# Patient Record
Sex: Male | Born: 1980 | Race: Black or African American | Hispanic: No | Marital: Single | Smoking: Never smoker
Health system: Southern US, Community
[De-identification: ages and names within clinical notes are randomized; demographics above are authoritative.]

---

## 2009-07-05 ENCOUNTER — Emergency Department (HOSPITAL_COMMUNITY): Admission: EM | Admit: 2009-07-05 | Discharge: 2009-07-05 | Payer: Self-pay | Admitting: Emergency Medicine

## 2012-06-08 ENCOUNTER — Encounter (HOSPITAL_COMMUNITY): Payer: Self-pay

## 2012-06-08 ENCOUNTER — Emergency Department (HOSPITAL_COMMUNITY)
Admission: EM | Admit: 2012-06-08 | Discharge: 2012-06-08 | Disposition: A | Discharge status: Home / Self Care | Payer: BC Managed Care – PPO | Attending: Emergency Medicine | Admitting: Emergency Medicine

## 2012-06-08 DIAGNOSIS — R369 Urethral discharge, unspecified: Secondary | ICD-10-CM | POA: Insufficient documentation

## 2012-06-08 DIAGNOSIS — N489 Disorder of penis, unspecified: Secondary | ICD-10-CM

## 2012-06-08 MED ORDER — CEFPODOXIME PROXETIL 200 MG PO TABS
400.0000 mg | ORAL_TABLET | Freq: Once | ORAL | Status: AC
  Administered 2012-06-08: 400 mg via ORAL
  Filled 2012-06-08: qty 2

## 2012-06-08 MED ORDER — AZITHROMYCIN 250 MG PO TABS
1000.0000 mg | ORAL_TABLET | Freq: Once | ORAL | Status: AC
  Administered 2012-06-08: 1000 mg via ORAL
  Filled 2012-06-08: qty 4

## 2012-06-08 NOTE — ED Notes (Signed)
Called lab to check on results of testing. Was advised both were send out. EDP notified.

## 2012-06-08 NOTE — Discharge Instructions (Signed)
You have been treated with antibiotics for STDs. The blood test for syphilis will not be back today. If it should come back positive, you will be notified so we can treat you.

## 2012-06-08 NOTE — ED Notes (Signed)
Having a painful erection and I had some blisters at the base of my penis per pt.

## 2012-06-08 NOTE — ED Notes (Signed)
Pt reports blisters to his penis, painful when erect. Unprotected sex w/ 1 partner. Pt states is having white discharge.

## 2012-06-08 NOTE — ED Notes (Signed)
Patient does not need anything at this time. 

## 2012-06-08 NOTE — ED Notes (Signed)
Pt alert & oriented x4, stable gait. Pt given discharge instructions, paperwork & prescription(s). Patient instructed to stop at the registration desk to finish any additional paperwork. pt verbalized understanding. Pt left department w/ no further questions.  

## 2012-06-09 LAB — RPR: RPR Ser Ql: NONREACTIVE

## 2012-06-09 NOTE — ED Provider Notes (Signed)
History     CSN: [id number]  Arrival date & time 06/08/12  0102   First MD Initiated Contact with Patient 06/08/12 0316      Chief Complaint  Patient presents with  . Penis Pain    (Consider location/radiation/quality/duration/timing/severity/associated sxs/prior treatment) HPI  Hunter Daugherty is a 31 y.o. male who presents to the Emergency Department complaining of painful erection, lesions at base of penis and penile discharge. Patient has had a partner for 4 months, having unprotected intercourse,  and noted that last week he developed a penile discharge. No pain with urination however, had a painful erection two days ago due to lesions at the base of the penis he has had for several days. Last intercourse was two days ago. Denies fever, chills, night sweats. History reviewed. No pertinent past medical history.  History reviewed. No pertinent past surgical history.  History reviewed. No pertinent family history.  History  Substance Use Topics  . Smoking status: Never Smoker   . Smokeless tobacco: Not on file  . Alcohol Use: Yes      Review of Systems  Constitutional: Negative for fever.       10 Systems reviewed and are negative for acute change except as noted in the HPI.  HENT: Negative for congestion.   Eyes: Negative for discharge and redness.  Respiratory: Negative for cough and shortness of breath.   Cardiovascular: Negative for chest pain.  Gastrointestinal: Negative for vomiting and abdominal pain.  Genitourinary: Positive for discharge, genital sores and penile pain.  Musculoskeletal: Negative for back pain.  Skin: Negative for rash.  Neurological: Negative for syncope, numbness and headaches.  Psychiatric/Behavioral:       No behavior change.    Allergies  Review of patient's allergies indicates no known allergies.  Home Medications  No current outpatient prescriptions on file.  BP 135/89  Pulse 75  Temp 98.3 F (36.8 C) (Oral)  Resp 16  Ht 5'  6" (1.676 m)  Wt 190 lb (86.183 kg)  BMI 30.67 kg/m2  SpO2 98%  Physical Exam  Nursing note and vitals reviewed. Constitutional:       Awake, alert, nontoxic appearance.  HENT:  Head: Atraumatic.  Eyes: Right eye exhibits no discharge. Left eye exhibits no discharge.  Neck: Neck supple.  Cardiovascular: Regular rhythm and normal heart sounds.   Pulmonary/Chest: Effort normal. He exhibits no tenderness.  Abdominal: Soft. There is no tenderness. There is no rebound.  Genitourinary:       Genital  exam performed with pt permission and male ED nurse chaparone present during exam.  Pt examined laying and standing.  No perineal erythema. Circumcised, healing abrasion type lesions to each side of the shaft of the penis as well as on the base of the penis. NO drainage. Penile discharge present. No scrotal erythema, edema or tenderness to palp.  Normal testicular lie.  No testicular tenderness to palp.  +cremasteric reflexes bilat.  No inguinal LAN or palpable masses.     Musculoskeletal: He exhibits no tenderness.       Baseline ROM, no obvious new focal weakness.  Neurological:       Mental status and motor strength appears baseline for patient and situation.  Skin: No rash noted.  Psychiatric: He has a normal mood and affect.    ED Course  Procedures (including critical care time)   Labs Reviewed  RPR  GC/CHLAMYDIA PROBE AMP, GENITAL   No results found.   1. Penile discharge  2. Penile lesion       MDM  Patient with concern of exposure to STDs, with a penile discharge and lesions to the base of the penis. Lesion are c/w friction abrasions and not herpetic or chancre. Discharge was cultured and is pending. Patient treated for STDs. RPR pending. Pt stable in ED with no significant deterioration in condition.The patient appears reasonably screened and/or stabilized for discharge and I doubt any other medical condition or other [facility] requiring further screening, evaluation, or  treatment in the ED at this time prior to discharge.  MDM Reviewed: nursing note and vitals           Nicoletta Dress. Colon Branch, MD 06/09/12 [phone]

## 2012-06-10 NOTE — ED Notes (Signed)
+   Gonorrhea Patient treated with rocephin and zithromax -[employer] letter faxed 

## 2013-02-23 ENCOUNTER — Emergency Department (HOSPITAL_COMMUNITY)
Admission: EM | Admit: 2013-02-23 | Discharge: 2013-02-23 | Disposition: A | Discharge status: Home / Self Care | Payer: Self-pay | Attending: Emergency Medicine | Admitting: Emergency Medicine

## 2013-02-23 ENCOUNTER — Encounter (HOSPITAL_COMMUNITY): Payer: Self-pay

## 2013-02-23 DIAGNOSIS — H9209 Otalgia, unspecified ear: Secondary | ICD-10-CM | POA: Insufficient documentation

## 2013-02-23 DIAGNOSIS — R059 Cough, unspecified: Secondary | ICD-10-CM | POA: Insufficient documentation

## 2013-02-23 DIAGNOSIS — J3489 Other specified disorders of nose and nasal sinuses: Secondary | ICD-10-CM | POA: Insufficient documentation

## 2013-02-23 DIAGNOSIS — J329 Chronic sinusitis, unspecified: Secondary | ICD-10-CM | POA: Insufficient documentation

## 2013-02-23 DIAGNOSIS — R51 Headache: Secondary | ICD-10-CM | POA: Insufficient documentation

## 2013-02-23 DIAGNOSIS — R05 Cough: Secondary | ICD-10-CM | POA: Insufficient documentation

## 2013-02-23 DIAGNOSIS — R6883 Chills (without fever): Secondary | ICD-10-CM | POA: Insufficient documentation

## 2013-02-23 DIAGNOSIS — M549 Dorsalgia, unspecified: Secondary | ICD-10-CM | POA: Insufficient documentation

## 2013-02-23 DIAGNOSIS — J40 Bronchitis, not specified as acute or chronic: Secondary | ICD-10-CM | POA: Insufficient documentation

## 2013-02-23 LAB — RAPID STREP SCREEN (MED CTR MEBANE ONLY): Streptococcus, Group A Screen (Direct): NEGATIVE

## 2013-02-23 MED ORDER — GUAIFENESIN-CODEINE 100-10 MG/5ML PO SYRP: 5.0000 mL | ORAL_SOLUTION | Freq: Three times a day (TID) | ORAL | Status: DC | PRN

## 2013-02-23 MED ORDER — PSEUDOEPHEDRINE HCL 60 MG PO TABS: 60.0000 mg | ORAL_TABLET | Freq: Three times a day (TID) | ORAL | Status: DC

## 2013-02-23 MED ORDER — AZITHROMYCIN 250 MG PO TABS: ORAL_TABLET | ORAL | Status: DC

## 2013-02-23 NOTE — ED Provider Notes (Signed)
History     CSN: [id number]  Arrival date & time 02/23/13  0903   First MD Initiated Contact with Patient 02/23/13 [phone]      Chief Complaint  Patient presents with  . Sore Throat   HPI Hunter Daugherty is a 32 y.o. male who presents to the ED with sore throat. The sore throat started 5 days ago. Associated symptoms include nasal drainage, sinus pain, ear pain and cough. The cough is productive with yellow/green sputum. He denies fever, has had chills. The history was provided by the patient.  History reviewed. No pertinent past medical history.  History reviewed. No pertinent past surgical history.  No family history on file.  History  Substance Use Topics  . Smoking status: Never Smoker   . Smokeless tobacco: Not on file  . Alcohol Use: Yes     Comment: occ      Review of Systems  Constitutional: Positive for chills. Negative for fever, diaphoresis and fatigue.  HENT: Positive for ear pain, congestion, sore throat and sinus pressure. Negative for facial swelling, neck pain, neck stiffness and dental problem.   Eyes: Negative for photophobia, pain and discharge.  Respiratory: Positive for cough. Negative for chest tightness and wheezing.   Cardiovascular: Negative for chest pain and palpitations.  Gastrointestinal: Negative for nausea, vomiting, abdominal pain, diarrhea, constipation and abdominal distention.  Genitourinary: Negative for dysuria, frequency, flank pain and difficulty urinating.  Musculoskeletal: Positive for back pain (with cough). Negative for myalgias and gait problem.  Skin: Negative for color change and rash.  Neurological: Positive for headaches. Negative for dizziness, speech difficulty, weakness, light-headedness and numbness.  Psychiatric/Behavioral: Negative for confusion and agitation. The patient is not nervous/anxious.     Allergies  Review of patient's allergies indicates no known allergies.  Home Medications  No current outpatient  prescriptions on file.  BP 122/93  Pulse 82  Temp(Src) 98.4 F (36.9 C) (Oral)  Resp 20  Ht 5\' 6"  (1.676 m)  Wt 200 lb (90.719 kg)  BMI 32.3 kg/m2  SpO2 99%  Physical Exam  Nursing note and vitals reviewed. Constitutional: He is oriented to person, place, and time. He appears well-developed and well-nourished. No distress.  HENT:  Head: Normocephalic and atraumatic.  Right Ear: Tympanic membrane normal.  Left Ear: Tympanic membrane normal.  Nose: Mucosal edema and rhinorrhea present.  Mouth/Throat: Uvula is midline and mucous membranes are normal. Posterior oropharyngeal erythema present.  Eyes: Conjunctivae and EOM are normal. Pupils are equal, round, and reactive to light.  Neck: Normal range of motion. Neck supple.  Cardiovascular: Normal rate and regular rhythm.   Pulmonary/Chest: Effort normal.  Occasional rhonchi   Abdominal: Soft. There is no tenderness.  Musculoskeletal: Normal range of motion. He exhibits no edema.  Lymphadenopathy:    He has cervical adenopathy.  Neurological: He is alert and oriented to person, place, and time. No cranial nerve deficit.  Skin: Skin is warm and dry.  Psychiatric: He has a normal mood and affect. His behavior is normal. Judgment and thought content normal.   Procedures  Assessment: 32 y.o. male with sore throat, ear ache and cough   Sinusitis headache   Bronchitis   Pharyngitis  Plan:  Increase PO fluids   Treat symptoms   Follow up with PCP, return as needed.    Medication List    TAKE these medications       azithromycin 250 MG tablet  Commonly known as:  ZITHROMAX  Take 2 tablets now  and then one tablet daily     guaiFENesin-codeine 100-10 MG/5ML syrup  Commonly known as:  ROBITUSSIN AC  Take 5 mLs by mouth 3 (three) times daily as needed for cough.     pseudoephedrine 60 MG tablet  Commonly known as:  SUDAFED  Take 1 tablet (60 mg total) by mouth 3 (three) times daily.             Janne Napoleon,  [place] 02/23/13 1730

## 2013-02-23 NOTE — ED Notes (Signed)
Pt reports sore throat for 6 days.  

## 2013-02-24 NOTE — ED Provider Notes (Signed)
Medical screening examination/treatment/procedure(s) were performed by non-physician practitioner and as supervising physician I was immediately available for consultation/collaboration. \  Joseph L Zammit, MD 02/24/13 1356 

## 2017-06-15 ENCOUNTER — Emergency Department (HOSPITAL_COMMUNITY)
Admission: EM | Admit: 2017-06-15 | Discharge: 2017-06-15 | Disposition: A | Discharge status: Home / Self Care | Payer: BLUE CROSS/BLUE SHIELD | Attending: Emergency Medicine | Admitting: Emergency Medicine

## 2017-06-15 ENCOUNTER — Emergency Department (HOSPITAL_COMMUNITY): Payer: BLUE CROSS/BLUE SHIELD

## 2017-06-15 ENCOUNTER — Encounter (HOSPITAL_COMMUNITY): Payer: Self-pay | Admitting: Emergency Medicine

## 2017-06-15 DIAGNOSIS — Y939 Activity, unspecified: Secondary | ICD-10-CM | POA: Insufficient documentation

## 2017-06-15 DIAGNOSIS — Y999 Unspecified external cause status: Secondary | ICD-10-CM | POA: Insufficient documentation

## 2017-06-15 DIAGNOSIS — Y92009 Unspecified place in unspecified non-institutional (private) residence as the place of occurrence of the external cause: Secondary | ICD-10-CM | POA: Diagnosis not present

## 2017-06-15 DIAGNOSIS — W228XXA Striking against or struck by other objects, initial encounter: Secondary | ICD-10-CM | POA: Insufficient documentation

## 2017-06-15 DIAGNOSIS — S99922A Unspecified injury of left foot, initial encounter: Secondary | ICD-10-CM | POA: Diagnosis present

## 2017-06-15 DIAGNOSIS — S92512A Displaced fracture of proximal phalanx of left lesser toe(s), initial encounter for closed fracture: Secondary | ICD-10-CM | POA: Diagnosis not present

## 2017-06-15 DIAGNOSIS — S92512B Displaced fracture of proximal phalanx of left lesser toe(s), initial encounter for open fracture: Secondary | ICD-10-CM

## 2017-06-15 MED ORDER — HYDROCODONE-ACETAMINOPHEN 5-325 MG PO TABS: 1.0000 | ORAL_TABLET | ORAL | 0 refills | Status: DC | PRN

## 2017-06-15 MED ORDER — CEPHALEXIN 500 MG PO CAPS: 500.0000 mg | ORAL_CAPSULE | Freq: Four times a day (QID) | ORAL | 0 refills | Status: DC

## 2017-06-15 MED ORDER — LIDOCAINE HCL (PF) 2 % IJ SOLN
10.0000 mL | Freq: Once | INTRAMUSCULAR | Status: DC
  Filled 2017-06-15: qty 10

## 2017-06-15 NOTE — ED Triage Notes (Signed)
Patient c/o laceration between left third and forth toe. Per patient was chasing child around house when he hit foot on door frame splitting toes. Patient unsure of last tetanus vaccination.

## 2017-06-15 NOTE — Discharge Instructions (Signed)
As discussed, you have a fracture of your 4th toe beneath the laceration that needs special care to help minimize risk of infection and to ensure this heals properly.  Wear the post op shoe at all times and keep your toes buddy taped to protect this injury.  Call Dr. Romeo AppleHarrison in the morning for an office visit early this week.  You may take the hydrocodone prescribed for pain relief.  This will make you drowsy - do not drive within 4 hours of taking this medication.

## 2017-06-15 NOTE — ED Notes (Signed)
Late entry- wound soaked for 30 minutes, then lac cleansed telfa between toes then toes buddy taped Dressing of 4x4 x 6, kling, and kerlex with post op shoe

## 2017-06-15 NOTE — ED Notes (Signed)
Foot soaking in 1/2 betadine, 1/2 NS- then cleaned between toes

## 2017-06-15 NOTE — ED Provider Notes (Signed)
[facility]-EMERGENCY DEPT Provider Note   CSN: [id number] Arrival date & time: 06/15/17  1542     History   Chief Complaint Chief Complaint  Patient presents with  . Laceration    HPI Hunter Daugherty is a 36 y.o. male presenting with pain and laceration to his left foot.  He was chasing his child's cat through the house when he stubbed his toes on a doorframe causing injury.  He has a laceration between his 3rd and 4th toes along with increasing pain and swelling.  He denies numbness in his toes but has been unable to flex the 3rd toe since the injury. He has had no treatment prior to arrival. His tetanus status is unknown.  The history is provided by the patient.    History reviewed. No pertinent past medical history.  There are no active problems to display for this patient.   History reviewed. No pertinent surgical history.     Home Medications    Prior to Admission medications   Medication Sig Start Date End Date Taking? Authorizing Provider  azithromycin (ZITHROMAX) 250 MG tablet Take 2 tablets now and then one tablet daily 02/23/13   Janne Napoleon, NP  cephALEXin (KEFLEX) 500 MG capsule Take 1 capsule (500 mg total) by mouth 4 (four) times daily. 06/15/17   Burgess Amor, PA-C  guaiFENesin-codeine (ROBITUSSIN AC) 100-10 MG/5ML syrup Take 5 mLs by mouth 3 (three) times daily as needed for cough. 02/23/13   Janne Napoleon, NP  HYDROcodone-acetaminophen (NORCO/VICODIN) 5-325 MG tablet Take 1 tablet by mouth every 4 (four) hours as needed. 06/15/17   Burgess Amor, PA-C  pseudoephedrine (SUDAFED) 60 MG tablet Take 1 tablet (60 mg total) by mouth 3 (three) times daily. 02/23/13   Janne Napoleon, NP    Family History History reviewed. No pertinent family history.  Social History Social History  Substance Use Topics  . Smoking status: Never Smoker  . Smokeless tobacco: Never Used  . Alcohol use Yes     Comment: occ     Allergies   Patient has no known allergies.   Review of  Systems Review of Systems  Constitutional: Negative for fever.  Musculoskeletal: Positive for arthralgias and joint swelling. Negative for myalgias.  Skin: Positive for wound.  Neurological: Negative for weakness and numbness.     Physical Exam Updated Vital Signs BP (!) 145/98 (BP Location: Right Arm)   Pulse 74   Temp 98.2 F (36.8 C) (Oral)   Resp 16   Ht 5\' 6"  (1.676 m)   Wt 90.7 kg (200 lb)   SpO2 100%   BMI 32.28 kg/m   Physical Exam  Constitutional: He appears well-developed and well-nourished.  HENT:  Head: Atraumatic.  Neck: Normal range of motion.  Cardiovascular:  Pulses:      Posterior tibial pulses are 2+ on the right side, and 2+ on the left side.  Pulses equal bilaterally  Musculoskeletal: He exhibits edema and tenderness.       Feet:  Distal sensation intact. Less than 2 sec cap refill in toes.  Neurological: He is alert. He has normal strength. He displays normal reflexes. No sensory deficit.  Skin: Skin is warm and dry.  Well approximated deep appearing laceration between the 3rd and 4th toes, hemostatic.  Psychiatric: He has a normal mood and affect.     ED Treatments / Results  Labs (all labs ordered are listed, but only abnormal results are displayed) Labs Reviewed - No data to  display  EKG  EKG Interpretation None       Radiology Dg Foot Complete Left  Result Date: 06/15/2017 CLINICAL DATA:  Recent fall with laceration and pain, initial encounter EXAM: LEFT FOOT - COMPLETE 3+ VIEW COMPARISON:  None. FINDINGS: There is an oblique fracture through the base of the fourth proximal phalanx with mild displacement. It extends into the metatarsophalangeal joint. This is associated within the known laceration between the 2 toes. No other acute bony abnormality is seen. Degenerative changes of the tarsal bones are seen. IMPRESSION: Oblique fracture through the base of the fourth proximal phalanx. Electronically Signed   By: Alcide CleverMark  Lukens M.D.   On:  06/15/2017 18:42    Procedures .Nerve Block Date/Time: 06/15/2017 6:30 PM Performed by: Burgess AmorIDOL, Jowanna Loeffler Authorized by: Burgess AmorIDOL, Montrice Montuori   Consent:    Consent obtained:  Verbal   Consent given by:  Patient   Risks discussed:  Nerve damage, swelling, unsuccessful block, pain and bleeding   Alternatives discussed:  No treatment Indications:    Indications:  Pain relief Location:    Laterality:  Left Pre-procedure details:    Skin preparation:  Povidone-iodine   Preparation: Patient was prepped and draped in usual sterile fashion   Procedure details (see MAR for exact dosages):    Block needle gauge:  25 G   Anesthetic injected:  Lidocaine 2% w/o epi   Steroid injected:  None   Additive injected:  None   Injection procedure:  Anatomic landmarks palpated and negative aspiration for blood   Paresthesia:  Immediately resolved Post-procedure details:    Dressing:  Sterile dressing   Outcome:  Anesthesia achieved   Patient tolerance of procedure:  Tolerated well, no immediate complications   (including critical care time)  Medications Ordered in ED Medications - No data to display   Initial Impression / Assessment and Plan / ED Course  I have reviewed the triage vital signs and the nursing notes.  Pertinent labs & imaging results that were available during my care of the patient were reviewed by me and considered in my medical decision making (see chart for details).     Per chart, pt's tetanus is current. Wound soaked in betadine/saline.  Buddy taping, dressing, post op shoe applied.    Discussed with Dr. Romeo AppleHarrison who will f/u with pcp for ongoing care. Pt to call for appt and is aware of plan.  Final Clinical Impressions(s) / ED Diagnoses   Final diagnoses:  Open displaced fracture of proximal phalanx of lesser toe of left foot, initial encounter    New Prescriptions Discharge Medication List as of 06/15/2017  7:43 PM    START taking these medications   Details  cephALEXin  (KEFLEX) 500 MG capsule Take 1 capsule (500 mg total) by mouth 4 (four) times daily., Starting Sun 06/15/2017, Print    HYDROcodone-acetaminophen (NORCO/VICODIN) 5-325 MG tablet Take 1 tablet by mouth every 4 (four) hours as needed., Starting Sun 06/15/2017, Print         Burgess AmorIdol, Matilde Pottenger, PA-C 06/16/17 1247    Samuel JesterMcManus, Kathleen, DO 06/18/17 1617

## 2017-06-20 ENCOUNTER — Encounter: Payer: Self-pay | Admitting: Orthopedic Surgery

## 2017-06-20 ENCOUNTER — Ambulatory Visit (INDEPENDENT_AMBULATORY_CARE_PROVIDER_SITE_OTHER): Payer: BLUE CROSS/BLUE SHIELD | Admitting: Orthopedic Surgery

## 2017-06-20 VITALS — BP 135/88 | HR 77 | Ht 68.0 in | Wt 200.0 lb

## 2017-06-20 DIAGNOSIS — S92515B Nondisplaced fracture of proximal phalanx of left lesser toe(s), initial encounter for open fracture: Secondary | ICD-10-CM

## 2017-06-20 MED ORDER — CEPHALEXIN 500 MG PO CAPS: 500.0000 mg | ORAL_CAPSULE | Freq: Four times a day (QID) | ORAL | 0 refills | Status: AC

## 2017-06-20 NOTE — Progress Notes (Signed)
  NEW PATIENT OFFICE VISIT    Chief Complaint  Patient presents with  . Foot Injury    left little toe fracture and laceration, DOI 06/15/17    36 year old male who sustained open fracture to his left small toe with a laceration between the third and fourth digit which was treated with irrigation dressing and antibiotics  He's doing pretty well mild discomfort no drainage or erythema or streaking is noted. The pain has gotten better with postop shoe    Review of Systems  Constitutional: Negative for chills and fever.  Skin: Negative for itching and rash.  Neurological: Negative for tingling.     No past medical history on file.  No past surgical history on file.  No family history on file. Social History  Substance Use Topics  . Smoking status: Never Smoker  . Smokeless tobacco: Never Used  . Alcohol use Yes     Comment: occ    BP 135/88   Pulse 77   Ht 5\' 8"  (1.727 m)   Wt 200 lb (90.7 kg)   BMI 30.41 kg/m   Physical Exam  Constitutional: He is oriented to person, place, and time. He appears well-developed and well-nourished.  Vital signs have been reviewed and are stable. Gen. appearance the patient is well-developed and well-nourished with normal grooming and hygiene.   Musculoskeletal:  GAIT IS slight limp  Neurological: He is alert and oriented to person, place, and time.  Skin: Skin is warm and dry. No erythema.  Psychiatric: He has a normal mood and affect.  Vitals reviewed.   Ortho Exam Inspection reveals laceration between third and fourth digit approximate 2 cm. No drainage. There is moisture between the toes. Toe alignment is normal. There is some tenderness at the fracture site painful range of motion is noted no instability  No atrophy in the foot. Skin as described. Sensation normal. Pulses normal. Color normal.  Patient has significant pes planus bilaterally   Meds ordered this encounter  Medications  . cephALEXin (KEFLEX) 500 MG capsule    Sig: Take 1 capsule (500 mg total) by mouth 4 (four) times daily.    Dispense:  28 capsule    Refill:  0    Encounter Diagnosis  Name Primary?  . Open nondisplaced fracture of proximal phalanx of lesser toe of left foot, initial encounter Yes     PLAN:   X-ray shows a nondisplaced fracture of the proximal phalanx it is an open fracture  Continue antibiotics 1 week  Out of work starting Monday for one week  Transition to regular shoe as tolerated  X-rays should be obtained in approximately 3 weeks

## 2017-06-20 NOTE — Patient Instructions (Signed)
OOW X 1 WEEK STARTING MON

## 2017-07-01 ENCOUNTER — Encounter: Payer: Self-pay | Admitting: Orthopedic Surgery

## 2017-07-11 ENCOUNTER — Ambulatory Visit (INDEPENDENT_AMBULATORY_CARE_PROVIDER_SITE_OTHER): Payer: BLUE CROSS/BLUE SHIELD

## 2017-07-11 ENCOUNTER — Ambulatory Visit (INDEPENDENT_AMBULATORY_CARE_PROVIDER_SITE_OTHER): Payer: Self-pay | Admitting: Orthopedic Surgery

## 2017-07-11 ENCOUNTER — Other Ambulatory Visit: Payer: Self-pay | Admitting: Orthopedic Surgery

## 2017-07-11 ENCOUNTER — Encounter: Payer: Self-pay | Admitting: Orthopedic Surgery

## 2017-07-11 DIAGNOSIS — S92515D Nondisplaced fracture of proximal phalanx of left lesser toe(s), subsequent encounter for fracture with routine healing: Secondary | ICD-10-CM | POA: Diagnosis not present

## 2017-07-11 NOTE — Progress Notes (Signed)
Fracture care follow-up  Chief Complaint  Patient presents with  . Follow-up    Recheck on left 4th toe fracture, DOI 06-15-17.     Patient is 26 days post injury of a fourth digit left foot proximal phalanx fracture. X-ray shows no change in position. Assuming fibrous union. He has a slight limp.  Probably can release today.  The incision is clean dry and intact no sign of infection to alignment normal    Patient released

## 2018-05-23 ENCOUNTER — Other Ambulatory Visit: Payer: Self-pay

## 2018-05-23 ENCOUNTER — Encounter (HOSPITAL_COMMUNITY): Payer: Self-pay | Admitting: Emergency Medicine

## 2018-05-23 ENCOUNTER — Emergency Department (HOSPITAL_COMMUNITY)
Admission: EM | Admit: 2018-05-23 | Discharge: 2018-05-23 | Disposition: A | Discharge status: Home / Self Care | Payer: BLUE CROSS/BLUE SHIELD | Attending: Emergency Medicine | Admitting: Emergency Medicine

## 2018-05-23 ENCOUNTER — Emergency Department (HOSPITAL_COMMUNITY): Payer: BLUE CROSS/BLUE SHIELD

## 2018-05-23 DIAGNOSIS — M542 Cervicalgia: Secondary | ICD-10-CM

## 2018-05-23 LAB — CBC WITH DIFFERENTIAL/PLATELET
BASOS ABS: 0 10*3/uL (ref 0.0–0.1)
BASOS PCT: 0 %
EOS ABS: 0 10*3/uL (ref 0.0–0.7)
Eosinophils Relative: 0 %
HEMATOCRIT: 43 % (ref 39.0–52.0)
Hemoglobin: 14.6 g/dL (ref 13.0–17.0)
Lymphocytes Relative: 12 %
Lymphs Abs: 1.1 10*3/uL (ref 0.7–4.0)
MCH: 30.4 pg (ref 26.0–34.0)
MCHC: 34 g/dL (ref 30.0–36.0)
MCV: 89.6 fL (ref 78.0–100.0)
MONO ABS: 0.6 10*3/uL (ref 0.1–1.0)
Monocytes Relative: 7 %
NEUTROS ABS: 6.9 10*3/uL (ref 1.7–7.7)
Neutrophils Relative %: 81 %
PLATELETS: 140 10*3/uL — AB (ref 150–400)
RBC: 4.8 MIL/uL (ref 4.22–5.81)
RDW: 13 % (ref 11.5–15.5)
WBC: 8.5 10*3/uL (ref 4.0–10.5)

## 2018-05-23 LAB — BASIC METABOLIC PANEL
ANION GAP: 7 (ref 5–15)
BUN: 10 mg/dL (ref 6–20)
CALCIUM: 9.5 mg/dL (ref 8.9–10.3)
CO2: 28 mmol/L (ref 22–32)
Chloride: 104 mmol/L (ref 101–111)
Creatinine, Ser: 0.88 mg/dL (ref 0.61–1.24)
GFR calc Af Amer: >60 mL/min (ref >60)
GLUCOSE: 91 mg/dL (ref 65–99)
Potassium: 4 mmol/L (ref 3.5–5.1)
Sodium: 139 mmol/L (ref 135–145)

## 2018-05-23 LAB — GROUP A STREP BY PCR: GROUP A STREP BY PCR: NOT DETECTED

## 2018-05-23 MED ORDER — IBUPROFEN 800 MG PO TABS: 800.0000 mg | ORAL_TABLET | Freq: Three times a day (TID) | ORAL | 0 refills | Status: DC

## 2018-05-23 MED ORDER — IBUPROFEN 800 MG PO TABS
800.0000 mg | ORAL_TABLET | Freq: Once | ORAL | Status: AC
  Administered 2018-05-23: 800 mg via ORAL
  Filled 2018-05-23: qty 1

## 2018-05-23 MED ORDER — IOHEXOL 300 MG/ML  SOLN
75.0000 mL | Freq: Once | INTRAMUSCULAR | Status: AC | PRN
  Administered 2018-05-23: 75 mL via INTRAVENOUS

## 2018-05-23 MED ORDER — CYCLOBENZAPRINE HCL 10 MG PO TABS: 10.0000 mg | ORAL_TABLET | Freq: Three times a day (TID) | ORAL | 0 refills | Status: DC | PRN

## 2018-05-23 MED ORDER — CYCLOBENZAPRINE HCL 10 MG PO TABS
10.0000 mg | ORAL_TABLET | Freq: Once | ORAL | Status: AC
  Administered 2018-05-23: 10 mg via ORAL
  Filled 2018-05-23: qty 1

## 2018-05-23 NOTE — ED Triage Notes (Signed)
Patient complaining of neck pain to sides and back of neck x 3-4 days. Denies fever or injury.

## 2018-05-23 NOTE — Discharge Instructions (Addendum)
Alternate ice and heat to your neck and shoulder.  Take the medication as directed.  Follow-up with your primary doctor or return here for any worsening symptoms such as fever, increasing neck pain, or difficulty swallowing.

## 2018-05-23 NOTE — ED Provider Notes (Signed)
[facility] EMERGENCY DEPARTMENT Provider Note   CSN: [id number] Arrival date & time: 05/23/18  4098     History   Chief Complaint Chief Complaint  Patient presents with  . Neck Pain    HPI Hunter Daugherty is a 37 y.o. male.  HPI   Hunter Daugherty is a 37 y.o. male who presents to the Emergency Department complaining of sore throat and neck pain for 3 days.  He describes a pain to the back and left side of his neck that is worse with movement.  States pain radiates across the top of his left shoulder and is worse with turning his head.  He also complains of a sore throat and states that his neck hurts when he swallows.  He also endorses sleeping on the sofa prior to onset of his symptoms.  He denies fever, chills, recent illness, cough and pain or numbness to his upper extremities.  He has tried [facility] powders without relief.  States he is not eating solid foods like normal due to pain.     History reviewed. No pertinent past medical history.  There are no active problems to display for this patient.   History reviewed. No pertinent surgical history.      Home Medications    Prior to Admission medications   Medication Sig Start Date End Date Taking? Authorizing Provider  cephALEXin (KEFLEX) 500 MG capsule Take 1 capsule (500 mg total) by mouth 4 (four) times daily. Patient not taking: Reported on 05/23/2018 06/20/17   Vickki Hearing, MD  HYDROcodone-acetaminophen (NORCO/VICODIN) 5-325 MG tablet Take 1 tablet by mouth every 4 (four) hours as needed. Patient not taking: Reported on 05/23/2018 06/15/17   Burgess Amor, PA-C    Family History History reviewed. No pertinent family history.  Social History Social History   Tobacco Use  . Smoking status: Never Smoker  . Smokeless tobacco: Never Used  Substance Use Topics  . Alcohol use: Yes    Comment: occ  . Drug use: Yes    Types: Marijuana     Allergies   Patient has no known allergies.   Review of Systems Review  of Systems  Constitutional: Negative for chills and fever.  HENT: Positive for sore throat and trouble swallowing.   Respiratory: Negative for cough, choking and shortness of breath.   Cardiovascular: Negative for chest pain.  Gastrointestinal: Negative for abdominal pain, nausea and vomiting.  Musculoskeletal: Positive for neck pain and neck stiffness. Negative for arthralgias and joint swelling.  Skin: Negative for color change and wound.  Neurological: Negative for weakness, numbness and headaches.  All other systems reviewed and are negative.    Physical Exam Updated Vital Signs BP (!) 156/98 (BP Location: Right Arm)   Pulse 81   Temp 99 F (37.2 C) (Oral)   Resp 18   Ht 5\' 9"  (1.753 m)   Wt 90.7 kg (200 lb)   SpO2 100%   BMI 29.53 kg/m   Physical Exam  Constitutional: He is oriented to person, place, and time. He appears well-developed and well-nourished. No distress.  HENT:  Head: Atraumatic.  Right Ear: Tympanic membrane and ear canal normal.  Left Ear: Tympanic membrane and ear canal normal.  Mouth/Throat: Uvula is midline and mucous membranes are normal. No oral lesions. No trismus in the jaw. No uvula swelling. No oropharyngeal exudate, posterior oropharyngeal edema, posterior oropharyngeal erythema or tonsillar abscesses.  Airway patent.  No edema, erythema, or exudates of the bilateral tonsils or  posterior oropharynx  Cardiovascular: Normal rate, regular rhythm and intact distal pulses.  Pulmonary/Chest: Effort normal. No respiratory distress.  Musculoskeletal: Normal range of motion.       Cervical back: He exhibits tenderness, bony tenderness and spasm. He exhibits no swelling.       Back:  ttp of the mid and left cervical paraspinal muscles and trapezius. 5/5 grip strength of the BUE's.    Neurological: He is alert and oriented to person, place, and time. He has normal strength. Gait normal. GCS eye subscore is 4. GCS verbal subscore is 5. GCS motor subscore is  6.  Skin: Skin is warm. Capillary refill takes less than 2 seconds.  Psychiatric: He has a normal mood and affect.  Nursing note and vitals reviewed.    ED Treatments / Results  Labs (all labs ordered are listed, but only abnormal results are displayed) Labs Reviewed  CBC WITH DIFFERENTIAL/PLATELET - Abnormal; Notable for the following components:      Result Value   Platelets 140 (*)    All other components within normal limits  GROUP A STREP BY PCR  BASIC METABOLIC PANEL    EKG None  Radiology Ct Soft Tissue Neck W Contrast  Result Date: 05/23/2018 CLINICAL DATA:  Neck pain over the last several days. Difficulty swallowing. Difficulty moving the neck. EXAM: CT NECK WITH CONTRAST TECHNIQUE: Multidetector CT imaging of the neck was performed using the standard protocol following the bolus administration of intravenous contrast. CONTRAST:  75mL OMNIPAQUE IOHEXOL 300 MG/ML  SOLN COMPARISON:  None. FINDINGS: Pharynx and larynx: No evidence of mucosal lesion. No evidence of tonsillitis or tonsillar/peritonsillar abscess. The patient does have a retropharyngeal effusion. This is nonspecific and could be inflammatory or reactive. There is prominent calcification associated with the C1-2 articulation anteriorly, which could cause a bursitis or longus colli tendinitis syndrome. No sign of loculated component or other deep space extension. Salivary glands: Parotid and submandibular glands are normal. There are multiple stones in the sublingual region on the left without evidence of inflammatory change. These are presumed to relate to the sublingual glands rather than the submandibular duct, as the left submandibular gland itself appears normal. Differential diagnosis would also include a soft tissue hemangioma. Thyroid: Normal Lymph nodes: No enlarged or low-density nodes on either side of the neck. Vascular: Normal Limited intracranial: Normal Visualized orbits: Normal Mastoids and visualized  paranasal sinuses: Clear Skeleton: Otherwise normal. Upper chest: Mild patchy density in the posterior right upper lobe on the last images. This could relate to small amount of pleural fluid or a small focus of atelectasis/infiltrate. Consider chest radiography. Other: None IMPRESSION: Retropharyngeal effusion without evidence of mucosal or tonsillar inflammation. Differential diagnosis is that of infectious inflammation versus is bursitis or longus colli tendinitis syndrome. 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 Multiple incidental calcifications in the sublingual region on the left. The differential diagnosis is that of sublingual stones versus stones related to a soft tissue hemangioma. These do not appear to be within the submandibular duct, as the left submandibular gland is normal. Electronically Signed   By: Paulina Fusi M.D.   On: 05/23/2018 11:40    Procedures Procedures (including critical care time)  Medications Ordered in ED Medications  ibuprofen (ADVIL,MOTRIN) tablet 800 mg (800 mg Oral Given 05/23/18 1021)  cyclobenzaprine (FLEXERIL) tablet 10 mg (10 mg Oral Given 05/23/18 1021)  iohexol (OMNIPAQUE) 300 MG/ML solution 75  mL (75 mLs Intravenous Contrast Given 05/23/18 1118)     Initial Impression / Assessment and Plan / ED Course  I have reviewed the triage vital signs and the nursing notes.  Pertinent labs & imaging results that were available during my care of the patient were reviewed by me and considered in my medical decision making (see chart for details).     Speech clear, pt is well appearing.  Airway patent.  Handles own secretions well.  Normal appearing oropharynx.  Labs and strep are reassuring.   1200 discussed CT findings with radiologist, Dr. Karin GoldenShogry, it was felt without evidence of infectious process that patient's symptoms are likely related to  longus colli tendinitis.  Patient agrees to treatment plan with NSAID and muscle relaxer.  Strict return precautions were discussed.   Final Clinical Impressions(s) / ED Diagnoses   Final diagnoses:  Neck pain    ED Discharge Orders    None       Pauline Ausriplett, Caysen Whang, PA-C 05/24/18 2047    Samuel JesterMcManus, Kathleen, DO 05/27/18 1235

## 2018-07-10 IMAGING — DX DG FOOT COMPLETE 3+V*L*
3 series · 3 of 3 positions shown · non-contrast
Comparison: None.

CLINICAL DATA: Recent fall with laceration and pain, initial
encounter

EXAM:
LEFT FOOT - COMPLETE 3+ VIEW

[foot ap]
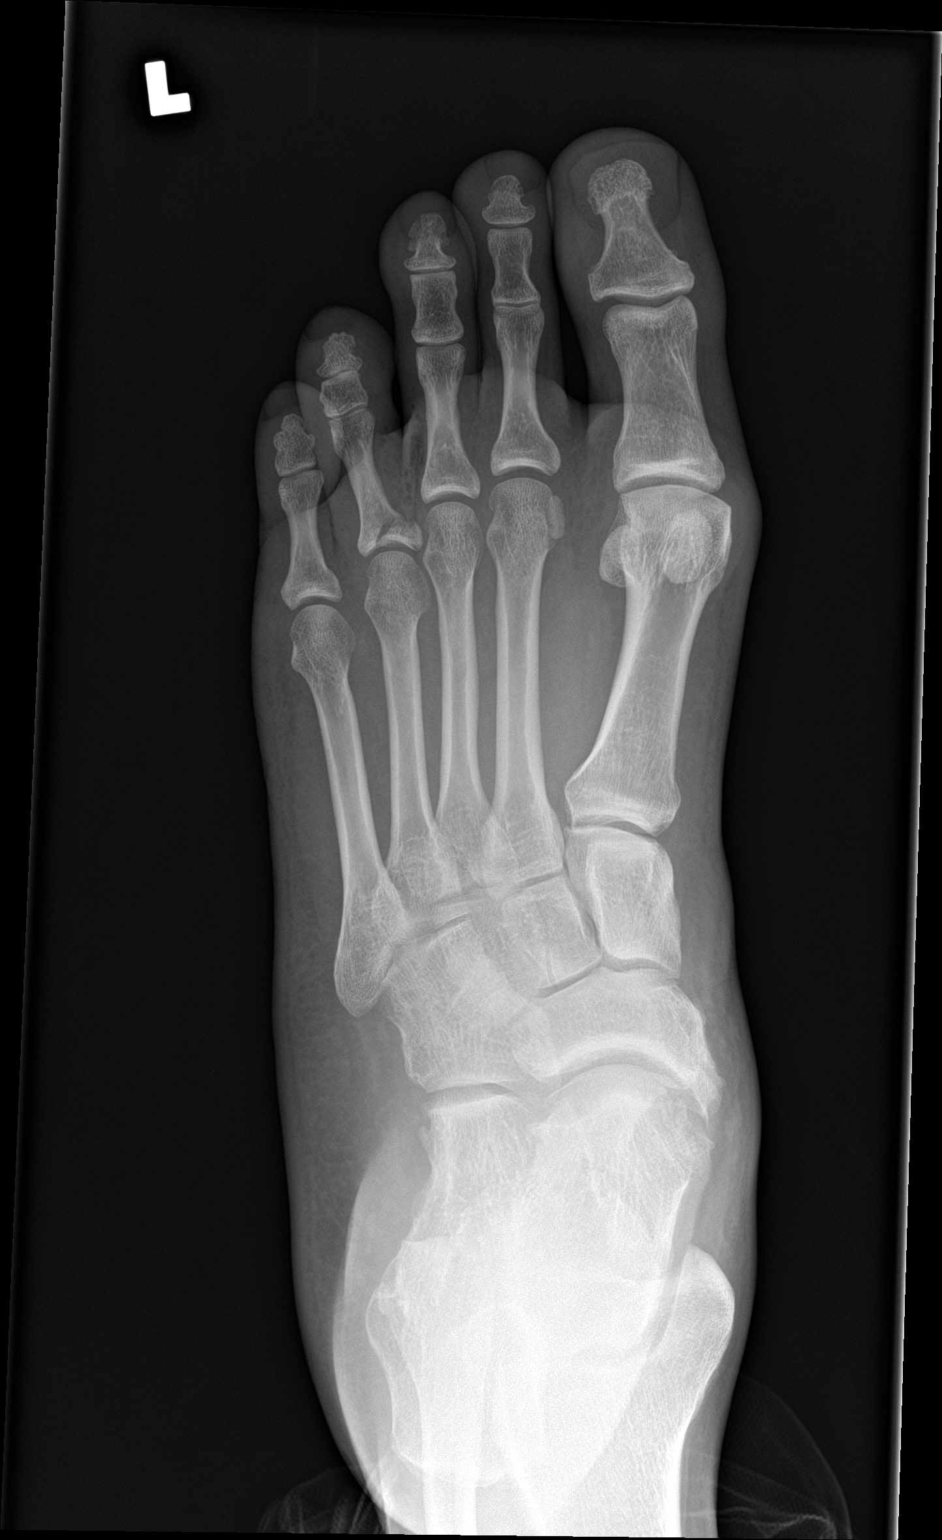

[foot obl]
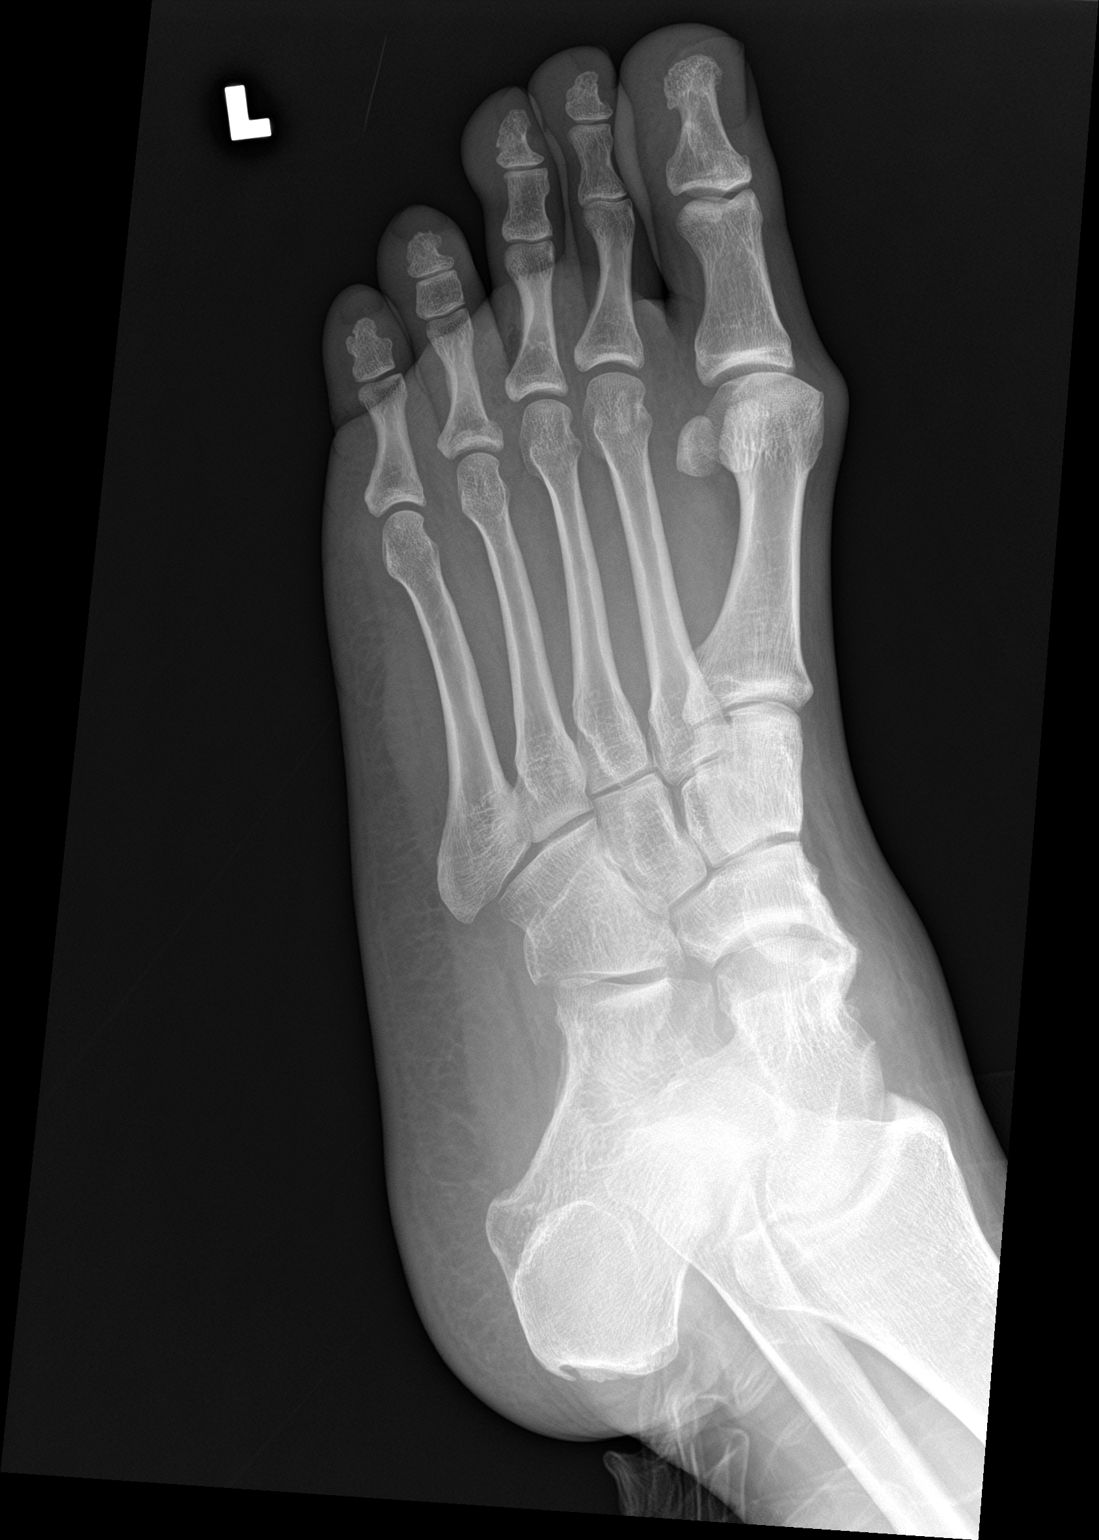

[foot lat]
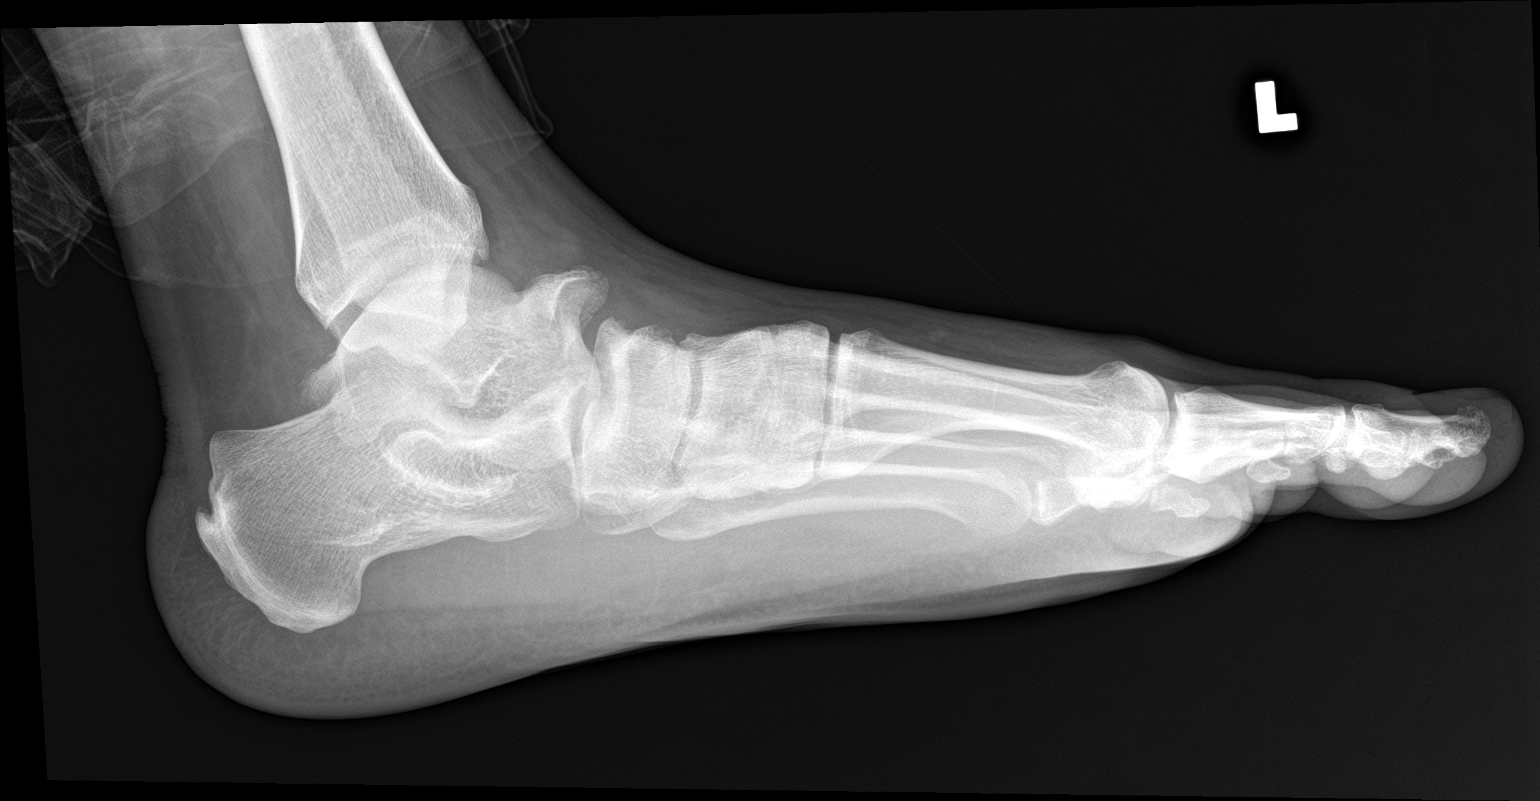

[3 of 3 positions shown; findings below may reference images not displayed]

FINDINGS: There is an oblique fracture through the base of the fourth proximal
phalanx with mild displacement. It extends into the
metatarsophalangeal joint. This is associated within the known
laceration between the 2 toes. No other acute bony abnormality is
seen. Degenerative changes of the tarsal bones are seen.
IMPRESSION: Oblique fracture through the base of the fourth proximal phalanx.

## 2019-04-09 ENCOUNTER — Other Ambulatory Visit: Payer: Self-pay

## 2019-04-09 ENCOUNTER — Emergency Department (HOSPITAL_COMMUNITY)
Admission: EM | Admit: 2019-04-09 | Discharge: 2019-04-09 | Disposition: A | Discharge status: Home / Self Care | Payer: BLUE CROSS/BLUE SHIELD | Attending: Emergency Medicine | Admitting: Emergency Medicine

## 2019-04-09 ENCOUNTER — Encounter (HOSPITAL_COMMUNITY): Payer: Self-pay | Admitting: Emergency Medicine

## 2019-04-09 DIAGNOSIS — L0231 Cutaneous abscess of buttock: Secondary | ICD-10-CM | POA: Insufficient documentation

## 2019-04-09 DIAGNOSIS — L0291 Cutaneous abscess, unspecified: Secondary | ICD-10-CM

## 2019-04-09 MED ORDER — SULFAMETHOXAZOLE-TRIMETHOPRIM 800-160 MG PO TABS
1.0000 | ORAL_TABLET | Freq: Once | ORAL | Status: AC
  Administered 2019-04-09: 13:00:00 1 via ORAL
  Filled 2019-04-09: qty 1

## 2019-04-09 MED ORDER — POVIDONE-IODINE 10 % EX SOLN
CUTANEOUS | Status: AC
  Administered 2019-04-09: 1
  Filled 2019-04-09: qty 15

## 2019-04-09 MED ORDER — SULFAMETHOXAZOLE-TRIMETHOPRIM 800-160 MG PO TABS: 1.0000 | ORAL_TABLET | Freq: Two times a day (BID) | ORAL | 0 refills | Status: AC

## 2019-04-09 MED ORDER — LIDOCAINE-EPINEPHRINE (PF) 2 %-1:200000 IJ SOLN
10.0000 mL | Freq: Once | INTRAMUSCULAR | Status: DC
  Filled 2019-04-09: qty 10

## 2019-04-09 MED ORDER — IBUPROFEN 600 MG PO TABS: 600.0000 mg | ORAL_TABLET | Freq: Four times a day (QID) | ORAL | 0 refills | Status: DC | PRN

## 2019-04-09 MED ORDER — IBUPROFEN 800 MG PO TABS
800.0000 mg | ORAL_TABLET | Freq: Once | ORAL | Status: AC
  Administered 2019-04-09: 800 mg via ORAL
  Filled 2019-04-09: qty 1

## 2019-04-09 NOTE — ED Triage Notes (Signed)
Patient states he has a cyst on his tailbone x 1 week. States he had same 2 years ago.

## 2019-04-09 NOTE — ED Provider Notes (Signed)
[facility] EMERGENCY DEPARTMENT Provider Note   CSN: [id number] Arrival date & time: 04/09/19  1230    History   Chief Complaint Chief Complaint  Patient presents with  . Cyst    HPI Hunter Daugherty is a 38 y.o. male.     Pt presents to the ED today with a cyst on his buttocks.  Pt said he had one there in the past, but it drained on its own.  He's had the cyst there for about 1 week and it is not getting any better.  Pt denies any f/c.     History reviewed. No pertinent past medical history.  There are no active problems to display for this patient.   History reviewed. No pertinent surgical history.      Home Medications    Prior to Admission medications   Medication Sig Start Date End Date Taking? Authorizing Provider  cephALEXin (KEFLEX) 500 MG capsule Take 1 capsule (500 mg total) by mouth 4 (four) times daily. Patient not taking: Reported on 05/23/2018 06/20/17   Vickki HearingHarrison, Stanley E, MD  cyclobenzaprine (FLEXERIL) 10 MG tablet Take 1 tablet (10 mg total) by mouth 3 (three) times daily as needed. 05/23/18   Triplett, Tammy, PA-C  HYDROcodone-acetaminophen (NORCO/VICODIN) 5-325 MG tablet Take 1 tablet by mouth every 4 (four) hours as needed. Patient not taking: Reported on 05/23/2018 06/15/17   Burgess AmorIdol, Calbert Hulsebus, PA-C  ibuprofen (ADVIL) 600 MG tablet Take 1 tablet (600 mg total) by mouth every 6 (six) hours as needed. 04/09/19   Jacalyn LefevreHaviland, Kamesha Herne, MD  sulfamethoxazole-trimethoprim (BACTRIM DS) 800-160 MG tablet Take 1 tablet by mouth 2 (two) times daily for 7 days. 04/09/19 04/16/19  Jacalyn LefevreHaviland, Zayvian Mcmurtry, MD    Family History History reviewed. No pertinent family history.  Social History Social History   Tobacco Use  . Smoking status: Never Smoker  . Smokeless tobacco: Never Used  Substance Use Topics  . Alcohol use: Yes    Comment: occ  . Drug use: Yes    Types: Marijuana     Allergies   Patient has no known allergies.   Review of Systems Review of Systems  Skin:        "cyst"  All other systems reviewed and are negative.    Physical Exam Updated Vital Signs BP (!) 147/79 (BP Location: Right Arm)   Pulse 93   Temp 98.5 F (36.9 C) (Oral)   Resp 18   Ht 5\' 6"  (1.676 m)   Wt 90.7 kg   SpO2 97%   BMI 32.28 kg/m   Physical Exam Vitals signs and nursing note reviewed.  Constitutional:      Appearance: Normal appearance.  HENT:     Head: Normocephalic and atraumatic.     Right Ear: External ear normal.     Left Ear: External ear normal.     Nose: Nose normal.     Mouth/Throat:     Mouth: Mucous membranes are moist.  Eyes:     Extraocular Movements: Extraocular movements intact.     Pupils: Pupils are equal, round, and reactive to light.  Neck:     Musculoskeletal: Normal range of motion and neck supple.  Cardiovascular:     Rate and Rhythm: Normal rate and regular rhythm.     Pulses: Normal pulses.     Heart sounds: Normal heart sounds.  Pulmonary:     Effort: Pulmonary effort is normal.     Breath sounds: Normal breath sounds.  Abdominal:  General: Abdomen is flat. Bowel sounds are normal.     Palpations: Abdomen is soft.  Genitourinary:    Comments: Abscess to right buttock Musculoskeletal: Normal range of motion.  Skin:    General: Skin is warm.     Capillary Refill: Capillary refill takes less than 2 seconds.  Neurological:     General: No focal deficit present.     Mental Status: He is alert and oriented to person, place, and time.  Psychiatric:        Mood and Affect: Mood normal.        Behavior: Behavior normal.      ED Treatments / Results  Labs (all labs ordered are listed, but only abnormal results are displayed) Labs Reviewed - No data to display  EKG None  Radiology No results found.  Procedures .Marland KitchenIncision and Drainage Date/Time: 04/09/2019 1:23 PM Performed by: Jacalyn Lefevre, MD Authorized by: Jacalyn Lefevre, MD   Consent:    Consent obtained:  Verbal   Consent given by:  Patient    Risks discussed:  Incomplete drainage and pain   Alternatives discussed:  No treatment Location:    Type:  Abscess   Size:  2 by 2   Location:  Anogenital   Anogenital location:  Gluteal cleft Pre-procedure details:    Skin preparation:  Betadine Anesthesia (see MAR for exact dosages):    Anesthesia method:  Local infiltration   Local anesthetic:  Lidocaine 2% WITH epi Procedure type:    Complexity:  Simple Procedure details:    Incision types:  Cruciate   Scalpel blade:  11   Wound management:  Probed and deloculated and irrigated with saline   Drainage:  Purulent   Drainage amount:  Copious   Wound treatment:  Wound left open   Packing materials:  None Post-procedure details:    Patient tolerance of procedure:  Tolerated well, no immediate complications   (including critical care time)  Medications Ordered in ED Medications  lidocaine-EPINEPHrine (XYLOCAINE W/EPI) 2 %-1:200000 (PF) injection 10 mL (has no administration in time range)  sulfamethoxazole-trimethoprim (BACTRIM DS) 800-160 MG per tablet 1 tablet (1 tablet Oral Given 04/09/19 1323)  ibuprofen (ADVIL) tablet 800 mg (800 mg Oral Given 04/09/19 1323)  povidone-iodine (BETADINE) 10 % external solution (1 application  Given 04/09/19 1323)     Initial Impression / Assessment and Plan / ED Course  I have reviewed the triage vital signs and the nursing notes.  Pertinent labs & imaging results that were available during my care of the patient were reviewed by me and considered in my medical decision making (see chart for details).       Pt will go home on bactrim.  Return if worse.  Final Clinical Impressions(s) / ED Diagnoses   Final diagnoses:  Abscess    ED Discharge Orders         Ordered    sulfamethoxazole-trimethoprim (BACTRIM DS) 800-160 MG tablet  2 times daily     04/09/19 1327    ibuprofen (ADVIL) 600 MG tablet  Every 6 hours PRN     04/09/19 1327           Jacalyn Lefevre, MD 04/09/19 1331

## 2022-10-26 ENCOUNTER — Emergency Department (HOSPITAL_COMMUNITY): Payer: No Typology Code available for payment source

## 2022-10-26 ENCOUNTER — Emergency Department (HOSPITAL_COMMUNITY)
Admission: EM | Admit: 2022-10-26 | Discharge: 2022-10-26 | Disposition: A | Discharge status: Home / Self Care | Payer: No Typology Code available for payment source | Attending: Emergency Medicine | Admitting: Emergency Medicine

## 2022-10-26 ENCOUNTER — Other Ambulatory Visit: Payer: Self-pay

## 2022-10-26 ENCOUNTER — Encounter (HOSPITAL_COMMUNITY): Payer: Self-pay

## 2022-10-26 DIAGNOSIS — S39012A Strain of muscle, fascia and tendon of lower back, initial encounter: Secondary | ICD-10-CM | POA: Insufficient documentation

## 2022-10-26 DIAGNOSIS — M542 Cervicalgia: Secondary | ICD-10-CM | POA: Diagnosis present

## 2022-10-26 DIAGNOSIS — M25512 Pain in left shoulder: Secondary | ICD-10-CM | POA: Insufficient documentation

## 2022-10-26 DIAGNOSIS — Y92410 Unspecified street and highway as the place of occurrence of the external cause: Secondary | ICD-10-CM | POA: Diagnosis not present

## 2022-10-26 DIAGNOSIS — S161XXA Strain of muscle, fascia and tendon at neck level, initial encounter: Secondary | ICD-10-CM | POA: Diagnosis not present

## 2022-10-26 MED ORDER — IBUPROFEN 800 MG PO TABS: 800.0000 mg | ORAL_TABLET | Freq: Three times a day (TID) | ORAL | 0 refills | Status: AC

## 2022-10-26 MED ORDER — IBUPROFEN 800 MG PO TABS
800.0000 mg | ORAL_TABLET | Freq: Once | ORAL | Status: AC
  Administered 2022-10-26: 800 mg via ORAL
  Filled 2022-10-26: qty 1

## 2022-10-26 MED ORDER — METHOCARBAMOL 500 MG PO TABS
500.0000 mg | ORAL_TABLET | Freq: Once | ORAL | Status: AC
  Administered 2022-10-26: 500 mg via ORAL
  Filled 2022-10-26: qty 1

## 2022-10-26 MED ORDER — METHOCARBAMOL 500 MG PO TABS: 500.0000 mg | ORAL_TABLET | Freq: Three times a day (TID) | ORAL | 0 refills | Status: AC

## 2022-10-26 NOTE — Discharge Instructions (Signed)
Please alternate ice and heat to your neck, back and shoulder area.  Avoid heavy lifting for least 1 week.  You may contact one of the providers listed to establish primary care, you may also contact Dr. Ruthe Mannan office to arrange orthopedic follow-up if needed.  Return to the emergency department for any new or worsening symptoms.
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
